# Patient Record
Sex: Female | Born: 1965 | Race: White | Hispanic: No | Smoking: Former smoker
Health system: Southern US, Community
[De-identification: ages and names within clinical notes are randomized; demographics above are authoritative.]

## PROBLEM LIST (undated history)

## (undated) DIAGNOSIS — R945 Abnormal results of liver function studies: Secondary | ICD-10-CM

## (undated) DIAGNOSIS — M797 Fibromyalgia: Secondary | ICD-10-CM

## (undated) DIAGNOSIS — G43909 Migraine, unspecified, not intractable, without status migrainosus: Secondary | ICD-10-CM

## (undated) DIAGNOSIS — F329 Major depressive disorder, single episode, unspecified: Secondary | ICD-10-CM

## (undated) DIAGNOSIS — E7800 Pure hypercholesterolemia, unspecified: Secondary | ICD-10-CM

## (undated) DIAGNOSIS — R42 Dizziness and giddiness: Secondary | ICD-10-CM

## (undated) DIAGNOSIS — R7989 Other specified abnormal findings of blood chemistry: Secondary | ICD-10-CM

## (undated) DIAGNOSIS — F419 Anxiety disorder, unspecified: Secondary | ICD-10-CM

## (undated) DIAGNOSIS — R7303 Prediabetes: Secondary | ICD-10-CM

## (undated) DIAGNOSIS — D649 Anemia, unspecified: Secondary | ICD-10-CM

## (undated) DIAGNOSIS — F32A Depression, unspecified: Secondary | ICD-10-CM

## (undated) DIAGNOSIS — M1990 Unspecified osteoarthritis, unspecified site: Secondary | ICD-10-CM

## (undated) DIAGNOSIS — J45909 Unspecified asthma, uncomplicated: Secondary | ICD-10-CM

## (undated) HISTORY — DX: Dizziness and giddiness: R42

## (undated) HISTORY — DX: Fibromyalgia: M79.7

## (undated) HISTORY — PX: BREAST SURGERY: SHX581

## (undated) HISTORY — PX: WISDOM TOOTH EXTRACTION: SHX21

## (undated) HISTORY — DX: Pure hypercholesterolemia, unspecified: E78.00

## (undated) HISTORY — DX: Abnormal results of liver function studies: R94.5

## (undated) HISTORY — DX: Migraine, unspecified, not intractable, without status migrainosus: G43.909

## (undated) HISTORY — DX: Other specified abnormal findings of blood chemistry: R79.89

## (undated) HISTORY — PX: CERVICAL CONE BIOPSY: SUR198

---

## 1986-10-01 HISTORY — PX: BREAST BIOPSY: SHX20

## 2006-04-29 ENCOUNTER — Ambulatory Visit: Payer: Self-pay | Admitting: Internal Medicine

## 2006-05-02 ENCOUNTER — Ambulatory Visit: Payer: Self-pay | Admitting: Internal Medicine

## 2006-07-22 ENCOUNTER — Ambulatory Visit (HOSPITAL_COMMUNITY): Admission: RE | Admit: 2006-07-22 | Discharge: 2006-07-22 | Payer: Self-pay | Admitting: Obstetrics & Gynecology

## 2006-09-06 ENCOUNTER — Emergency Department (HOSPITAL_COMMUNITY): Admission: EM | Admit: 2006-09-06 | Discharge: 2006-09-06 | Payer: Self-pay | Admitting: Emergency Medicine

## 2010-11-08 ENCOUNTER — Ambulatory Visit: Payer: Self-pay | Admitting: Unknown Physician Specialty

## 2011-11-13 ENCOUNTER — Ambulatory Visit: Payer: Self-pay | Admitting: Unknown Physician Specialty

## 2012-06-09 ENCOUNTER — Emergency Department: Payer: Self-pay | Admitting: Emergency Medicine

## 2012-06-09 LAB — BASIC METABOLIC PANEL
BUN: 15 mg/dL (ref 7–18)
Chloride: 109 mmol/L — ABNORMAL HIGH (ref 98–107)
Co2: 23 mmol/L (ref 21–32)
Creatinine: 0.88 mg/dL (ref 0.60–1.30)
Potassium: 3.6 mmol/L (ref 3.5–5.1)
Sodium: 139 mmol/L (ref 136–145)

## 2012-06-09 LAB — TROPONIN I: Troponin-I: <0.02 ng/mL

## 2012-06-09 LAB — CBC
HCT: 40.6 % (ref 35.0–47.0)
MCHC: 35.4 g/dL (ref 32.0–36.0)
RDW: 12.8 % (ref 11.5–14.5)

## 2012-11-26 ENCOUNTER — Ambulatory Visit: Payer: Self-pay | Admitting: Neurology

## 2013-03-02 ENCOUNTER — Encounter: Payer: Self-pay | Admitting: Nurse Practitioner

## 2013-03-02 ENCOUNTER — Ambulatory Visit (INDEPENDENT_AMBULATORY_CARE_PROVIDER_SITE_OTHER): Payer: 59 | Admitting: Nurse Practitioner

## 2013-03-02 VITALS — BP 124/70 | HR 101 | Ht 64.0 in | Wt 134.0 lb

## 2013-03-02 DIAGNOSIS — G43909 Migraine, unspecified, not intractable, without status migrainosus: Secondary | ICD-10-CM

## 2013-03-02 DIAGNOSIS — R42 Dizziness and giddiness: Secondary | ICD-10-CM

## 2013-03-02 NOTE — Patient Instructions (Addendum)
Discussed  migraine triggers, nothing new Increase fluids to 8 to 10 glasses day Will get EEG to rule out seizure activity Continue current meds for headache

## 2013-03-02 NOTE — Progress Notes (Signed)
HPI: Patient returns for followup after last visit with Dr. Terrace Arabia and 12/01/2012. She has a history of dizziness for about one year she also has a history of migraine headaches, hyperlipidemia fibromyalgia, diffuse back and upper neck pain. She was placed on nortriptyline for her headaches and is now having approximately 3 migraines per month. She uses Imitrex acutely. She says she typically misses a day of work every month. She has dizziness every day particularly with position changes, she said she was unable to drive here today due to her dizziness. She is with her daughter. MRI of the brain in the past with normal. Lack of sleep causes  migraine. She denies that any foods cause a problem. She gets no regular exercise  ROS:  - headache, joint pain, fatigue, dizziness, swelling in legs, spinning senation  Physical Exam General: well developed, well nourished, seated, in no evident distress Head: head normocephalic and atraumatic. Oropharynx benign, cerumen on the right.  Neck: supple with no carotid  bruits Cardiovascular: regular rate and rhythm, no murmurs  Neurologic Exam Mental Status: Awake and fully alert. Oriented to place and time. Follows all commands. Speech and language normal.   Cranial Nerves:  Pupils equal, briskly reactive to light. Extraocular movements full without nystagmus. Visual fields full to confrontation. Hearing intact and symmetric to finger snap. Facial sensation intact. Face, tongue, palate move normally and symmetrically. Neck flexion and extension normal.  Motor: Normal bulk and tone. Normal strength in all tested extremity muscles.No focal weakness Sensory.: intact to touch and pinprick and vibratory.  Coordination: Rapid alternating movements normal in all extremities. Finger-to-nose and heel-to-shin performed accurately bilaterally. Gait and Station: Arises from chair without difficulty. Stance is normal. Gait demonstrates normal stride length and balance . Able to  heel, toe and tandem walk without difficulty.  Reflexes: 2+ and symmetric. Toes downgoing.     ASSESSMENT: Migraine currently on nortriptyline 20 mg at night, and Imitrex acutely. Estimates she has 3 migraines per month. Dizziness that occurs every day, MRI of the brain and labs have been normal  No significant ortho stasis with B/P in lying, seated and standing position Does not drink enough water daily      PLAN: Discussed with Dr. Terrace Arabia Will get EEG to rule out seizure Keep well hydrated with 8-10 glasses of water daily Followup in 3 months Okay to stop magnesium and B  vitamins if not beneficial Additional 15 min spent face to face answering questions and discussing the next step for treatment  Nilda Riggs, GNP-BC APRN

## 2013-03-13 ENCOUNTER — Other Ambulatory Visit: Payer: 59

## 2013-06-08 ENCOUNTER — Ambulatory Visit (INDEPENDENT_AMBULATORY_CARE_PROVIDER_SITE_OTHER): Payer: 59 | Admitting: Nurse Practitioner

## 2013-06-08 ENCOUNTER — Encounter: Payer: Self-pay | Admitting: Nurse Practitioner

## 2013-06-08 VITALS — BP 111/69 | HR 78 | Ht 63.75 in | Wt 131.0 lb

## 2013-06-08 DIAGNOSIS — G43909 Migraine, unspecified, not intractable, without status migrainosus: Secondary | ICD-10-CM

## 2013-06-08 DIAGNOSIS — R42 Dizziness and giddiness: Secondary | ICD-10-CM

## 2013-06-08 NOTE — Patient Instructions (Addendum)
Continue nortriptyline 20 mg daily, may take 10 in the morning and 10 at night if necessary Try melatonin 3 mg when necessary for sleep Continue Imitrex acutely  followup in 6 months

## 2013-06-08 NOTE — Progress Notes (Signed)
[facility]  PATIENT: Robin Thomas DOB: 08-15-1966   REASON FOR VISIT: Followup for migraines and dizziness    HISTORY OF PRESENT ILLNESS: Robin Thomas, 47 year old white female returns for followup. She was last seen in our office 03/02/2013. She has a history of dizziness for about one year she also has a history of migraine headaches, hyperlipidemia fibromyalgia, diffuse back and upper neck pain. She was placed on nortriptyline for her headaches and is now having approximately 3 migraines per month. She uses Imitrex acutely. She says she typically misses a day of work every month. Her dizziness has improved but when it does occur usually late in the day. She was scheduled for EEG however she canceled the test  MRI of the brain in the past with normal. Lack of sleep causes migraine. She denies that any foods cause a problem. She gets no regular exercise. Her liver functions were found to be elevated recently. She just has  stopped smoking.     REVIEW OF SYSTEMS: Full 14 system review of systems performed and notable only for:  Constitutional: N/A  Cardiovascular: Swelling in the ankles Ear/Nose/Throat: N/A  Skin: N/A  Eyes: N/A  Respiratory: N/A  Gastroitestinal: N/A  Hematology/Lymphatic: N/A  Endocrine: Feeling hot, flushing  Musculoskeletal: Joint pain  Allergy/Immunology: N/A  Neurological: Headache, occasional dizziness  Psychiatric: Insomnia   ALLERGIES: No Known Allergies  HOME MEDICATIONS: Outpatient Prescriptions Prior to Visit  Medication Sig Dispense Refill  . Cholecalciferol (D3 ADULT PO) Take by mouth.      . fish oil-omega-3 fatty acids 1000 MG capsule Take 2 g by mouth daily.      . magnesium oxide (MAG-OX) 400 MG tablet Take 400 mg by mouth daily.      . nortriptyline (PAMELOR) 10 MG capsule Take 10 mg by mouth 2 (two) times daily.       . Riboflavin (B-2 PO) Take by mouth.      . SUMAtriptan (IMITREX) 100 MG tablet Take 100 mg by mouth  every 2 (two) hours as needed for migraine.        No facility-administered medications prior to visit.    PAST MEDICAL HISTORY: Past Medical History  Diagnosis Date  . High cholesterol   . Migraine   . Fibromyalgia   . Dizziness and giddiness     PAST SURGICAL HISTORY: Past Surgical History  Procedure Laterality Date  . Cervical cone biopsy      cervix  . Wisdom tooth extraction      FAMILY HISTORY: History reviewed. No pertinent family history.  SOCIAL HISTORY: History   Social History  . Marital Status: Legally Separated    Spouse Name: N/A    Number of Children: N/A  . 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   Social History Main Topics  . Smoking status: Former Smoker    Types: Cigarettes    Quit date: 03/31/2013  . Smokeless tobacco: Never Used  . Alcohol Use: Yes     Comment: 2 times on weekends  . Drug Use: No  . Sexual Activity: Not on file   Other Topics Concern  . Not on file   Social History Narrative   Patient lives at home with her son and works at [employer]. Patient drinks caffeine daily. Patient has a college degree.      PHYSICAL EXAM  Filed Vitals:   06/08/13 1543  BP: 111/69  Pulse: 78  Height: 5' 3.75" (1.619  m)  Weight: 131 lb (59.421 kg)   Body mass index is 22.67 kg/(m^2).  Generalized: Well developed, in no acute distress  Head: normocephalic and atraumatic,. Oropharynx benign  Neck: Supple, no carotid bruits  Cardiac: Regular rate rhythm, no murmur    Neurological examination   Mentation: Alert oriented to time, place, history taking. Follows all commands speech and language fluent  Cranial nerve II-XII: Pupils were equal round reactive to light extraocular movements were full, visual field were full on confrontational test. Facial sensation and strength were normal. hearing was intact to finger rubbing bilaterally. Uvula tongue midline. head turning and shoulder shrug and were  normal and symmetric.Tongue protrusion into cheek strength was normal. Motor: normal bulk and tone, full strength in the BUE, BLE, fine finger movements normal.  No focal weakness Sensory: normal and symmetric to light touch, pinprick, and  vibration  Coordination: finger-nose-finger, heel-to-shin bilaterally, no dysmetria Reflexes: Brachioradialis 2/2, biceps 2/2, triceps 2/2, patellar 2/2, Achilles 2/2, plantar responses were flexor bilaterally. Gait and Station: Rising up from seated position without assistance, normal stance, moderate stride, good arm swing, smooth turning, able to perform tiptoe, and heel walking without difficulty.   DIAGNOSTIC DATA (LABS, IMAGING, TESTING) - none for review   ASSESSMENT AND PLAN  47 y.o. year old female  has a past medical history of High cholesterol; Migraine; Fibromyalgia; and Dizziness and giddiness. here for followup. Dizziness is actually gotten better, she canceled her EEG. She also complains of insomnia Continue nortriptyline 20 mg daily, may take 10 in the morning and 10 at night if necessary Try melatonin 3 mg when necessary for sleep Continue Imitrex acutely  Followup in 6 months  Nilda Riggs, [facility], [facility], APRN  [facility] [address] [place], [place] [address] [phone]

## 2013-07-01 ENCOUNTER — Other Ambulatory Visit: Payer: Self-pay

## 2013-07-01 MED ORDER — NORTRIPTYLINE HCL 10 MG PO CAPS: 10.0000 mg | ORAL_CAPSULE | Freq: Two times a day (BID) | ORAL | Status: DC

## 2013-12-07 ENCOUNTER — Ambulatory Visit: Payer: 59 | Admitting: Nurse Practitioner

## 2013-12-15 ENCOUNTER — Ambulatory Visit (INDEPENDENT_AMBULATORY_CARE_PROVIDER_SITE_OTHER): Payer: 59 | Admitting: Nurse Practitioner

## 2013-12-15 ENCOUNTER — Encounter: Payer: Self-pay | Admitting: Nurse Practitioner

## 2013-12-15 ENCOUNTER — Encounter (INDEPENDENT_AMBULATORY_CARE_PROVIDER_SITE_OTHER): Payer: Self-pay

## 2013-12-15 VITALS — BP 109/72 | HR 111 | Ht 63.0 in | Wt 131.0 lb

## 2013-12-15 DIAGNOSIS — G43909 Migraine, unspecified, not intractable, without status migrainosus: Secondary | ICD-10-CM

## 2013-12-15 DIAGNOSIS — R42 Dizziness and giddiness: Secondary | ICD-10-CM

## 2013-12-15 MED ORDER — NORTRIPTYLINE HCL 10 MG PO CAPS: 10.0000 mg | ORAL_CAPSULE | Freq: Two times a day (BID) | ORAL | Status: DC

## 2013-12-15 NOTE — Progress Notes (Signed)
[facility]  PATIENT: Robin Thomas DOB: 11-29-1965   REASON FOR VISIT: Followup for migraines and history of dizziness    HISTORY OF PRESENT ILLNESS:Robin Thomas, 48 year old female returns for followup. She has a history of migraines with approximately one bad migraine per month and Imitrex works acutely. She continues to have dizziness every day.  MRI of the brain and labs have been normal in the past. She denies any significant orthostasis with blood pressure in lying seated and standing position. She had been asked to get an EEG in the past to rule out seizure events but she canceled that. Triggers  for migraine are lack of  sleep, she denies seasonal allergies or any foods that cause a problem. She does not exercise on a routine basis. She returns for reevaluation  HISTORY:of dizziness for about one year she also has a history of migraine headaches, hyperlipidemia fibromyalgia, diffuse back and upper neck pain. She was placed on nortriptyline for her headaches and is now having approximately 3 migraines per month. She uses Imitrex acutely. She says she typically misses a day of work every month. Her dizziness has improved but when it does occur usually late in the day. She was scheduled for EEG however she canceled the test MRI of the brain in the past with normal. Lack of sleep causes migraine. She denies that any foods cause a problem. She gets no regular exercise. Her liver functions were found to be elevated recently. She just has stopped smoking.    REVIEW OF SYSTEMS: Full 14 system review of systems performed and notable only for those listed, all others are neg:  Constitutional: N/A  Cardiovascular: N/A  Ear/Nose/Throat: N/A  Skin: N/A  Eyes: Light sensitivity  Respiratory: N/A  Gastroitestinal: N/A  Hematology/Lymphatic: N/A  Endocrine: N/A Musculoskeletal: Joint pain Allergy/Immunology: N/A  Neurological: Dizziness, headache Psychiatric:  N/A   ALLERGIES: No Known Allergies  HOME MEDICATIONS: Outpatient Prescriptions Prior to Visit  Medication Sig Dispense Refill  . Cholecalciferol (D3 ADULT PO) Take by mouth.      . fish oil-omega-3 fatty acids 1000 MG capsule Take 2 g by mouth daily.      . magnesium oxide (MAG-OX) 400 MG tablet Take 400 mg by mouth daily.      . nortriptyline (PAMELOR) 10 MG capsule Take 1 capsule (10 mg total) by mouth 2 (two) times daily.  180 capsule  1  . Riboflavin (B-2 PO) Take by mouth.      . SUMAtriptan (IMITREX) 100 MG tablet Take 100 mg by mouth every 2 (two) hours as needed for migraine.       Robin Thomas. CHANTIX CONTINUING MONTH PAK 1 MG tablet        No facility-administered medications prior to visit.    PAST MEDICAL HISTORY: Past Medical History  Diagnosis Date  . High cholesterol   . Migraine   . Fibromyalgia   . Dizziness and giddiness   . Elevated liver function tests     PAST SURGICAL HISTORY: Past Surgical History  Procedure Laterality Date  . Cervical cone biopsy      cervix  . Wisdom tooth extraction      FAMILY HISTORY: History reviewed. No pertinent family history.  SOCIAL HISTORY: History   Social History  . Marital Status: Legally Separated    Spouse Name: N/A    Number of Children: 3  . Years of Education: Assoc    Occupational History  . Not on file.   Social  History Main Topics  . Smoking status: Former Smoker    Types: Cigarettes    Quit date: 03/31/2013  . Smokeless tobacco: Never Used  . Alcohol Use: Yes     Comment: 2 times on weekends  . Drug Use: No  . Sexual Activity: Not on file   Other Topics Concern  . Not on file   Social History Narrative   Patient lives at home with her son and works at [employer]. Patient drinks caffeine daily. Patient has a college degree.    Patient has 3 children.    Patient has an Associates degree.            PHYSICAL EXAM  Filed Vitals:   12/15/13 1511 12/15/13 1512  BP: 112/67 109/72  Pulse: 94 111   Height: 5\' 3"  (1.6 m)   Weight: 131 lb (59.421 kg)    Body mass index is 23.21 kg/(m^2).  Generalized: Well developed, in no acute distress  Head: normocephalic and atraumatic,. Oropharynx benign  Neck: Supple, no carotid bruits  Cardiac: Regular rate rhythm, no murmur  Musculoskeletal: No deformity   Neurological examination   Mentation: Alert oriented to time, place, history taking. Follows all commands speech and language fluent  Cranial nerve II-XII: Pupils were equal round reactive to light extraocular movements were full, visual field were full on confrontational test. Facial sensation and strength were normal. hearing was intact to finger rubbing bilaterally. Uvula tongue midline. head turning and shoulder shrug were normal and symmetric.Tongue protrusion into cheek strength was normal. Motor: normal bulk and tone, full strength in the BUE, BLE, No focal weakness Sensory: normal and symmetric to light touch, pinprick, and  vibration  Coordination: finger-nose-finger, heel-to-shin bilaterally, no dysmetria Reflexes: Brachioradialis 2/2, biceps 2/2, triceps 2/2, patellar 2/2, Achilles 2/2, plantar responses were flexor bilaterally. Gait and Station: Rising up from seated position without assistance, normal stance,  moderate stride, good arm swing, smooth turning, able to perform tiptoe, and heel walking without difficulty. Tandem gait is steady  DIAGNOSTIC DATA (LABS, IMAGING, TESTING) -  ASSESSMENT AND PLAN  48 y.o. year old female  has a past medical history of High cholesterol; Migraine; Fibromyalgia; Dizziness and giddiness; and Elevated liver function tests. here to followup. She has one migraine per month relieved with Imitrex however she continues to have dizziness daily, MRI of the brain and labs  within normal limits in the past. She had been scheduled for an EEG but she canceled this.  EEG for continued complaints of dizziness Continue nortriptyline at the current dose  will refill mail-order Continue Imitrex acutely Followup in 6 months  Nilda Riggs, [facility], [facility], APRN  [facility] [address], [address] [place], [place] [address] [phone]

## 2013-12-15 NOTE — Patient Instructions (Signed)
EEG for continued complaints of dizziness Continue nortriptyline at the current dose will refill mail-order Continue Imitrex acutely Followup in 6 months

## 2014-04-19 ENCOUNTER — Other Ambulatory Visit: Payer: Self-pay | Admitting: Nurse Practitioner

## 2014-06-18 ENCOUNTER — Ambulatory Visit: Payer: 59 | Admitting: Nurse Practitioner

## 2014-07-09 ENCOUNTER — Other Ambulatory Visit: Payer: Self-pay | Admitting: Neurology

## 2014-11-03 ENCOUNTER — Other Ambulatory Visit: Payer: Self-pay | Admitting: Neurology

## 2015-02-15 ENCOUNTER — Ambulatory Visit: Payer: Self-pay | Admitting: Nurse Practitioner

## 2016-01-01 ENCOUNTER — Encounter: Payer: Self-pay | Admitting: Emergency Medicine

## 2016-01-01 ENCOUNTER — Emergency Department
Admission: EM | Admit: 2016-01-01 | Discharge: 2016-01-01 | Disposition: A | Discharge status: Home / Self Care | Payer: 59 | Attending: Student | Admitting: Student

## 2016-01-01 DIAGNOSIS — M65831 Other synovitis and tenosynovitis, right forearm: Secondary | ICD-10-CM

## 2016-01-01 DIAGNOSIS — Y9389 Activity, other specified: Secondary | ICD-10-CM | POA: Diagnosis not present

## 2016-01-01 DIAGNOSIS — S59911A Unspecified injury of right forearm, initial encounter: Secondary | ICD-10-CM | POA: Diagnosis present

## 2016-01-01 DIAGNOSIS — S50811A Abrasion of right forearm, initial encounter: Secondary | ICD-10-CM

## 2016-01-01 DIAGNOSIS — Y92410 Unspecified street and highway as the place of occurrence of the external cause: Secondary | ICD-10-CM | POA: Diagnosis not present

## 2016-01-01 DIAGNOSIS — S50812A Abrasion of left forearm, initial encounter: Secondary | ICD-10-CM | POA: Insufficient documentation

## 2016-01-01 DIAGNOSIS — M65841 Other synovitis and tenosynovitis, right hand: Secondary | ICD-10-CM | POA: Diagnosis not present

## 2016-01-01 DIAGNOSIS — L089 Local infection of the skin and subcutaneous tissue, unspecified: Secondary | ICD-10-CM

## 2016-01-01 DIAGNOSIS — Z87891 Personal history of nicotine dependence: Secondary | ICD-10-CM | POA: Diagnosis not present

## 2016-01-01 DIAGNOSIS — Y999 Unspecified external cause status: Secondary | ICD-10-CM | POA: Insufficient documentation

## 2016-01-01 MED ORDER — MELOXICAM 15 MG PO TABS
15.0000 mg | ORAL_TABLET | Freq: Every day | ORAL | Status: DC
Start: 2016-01-01 — End: 2016-08-21

## 2016-01-01 MED ORDER — CYCLOBENZAPRINE HCL 5 MG PO TABS: 5.0000 mg | ORAL_TABLET | Freq: Three times a day (TID) | ORAL | Status: DC | PRN
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
## 2016-01-01 NOTE — ED Notes (Signed)
Discussed discharge instructions, prescriptions, and follow-up care with patient. No questions or concerns at this time. Pt stable at discharge.  

## 2016-08-28 ENCOUNTER — Ambulatory Visit
Admission: RE | Admit: 2016-08-28 | Discharge: 2016-08-28 | Disposition: A | Discharge status: Home / Self Care | Payer: 59 | Source: Ambulatory Visit | Attending: Obstetrics & Gynecology | Admitting: Obstetrics & Gynecology

## 2016-08-28 ENCOUNTER — Other Ambulatory Visit: Payer: 59

## 2016-08-28 DIAGNOSIS — N950 Postmenopausal bleeding: Secondary | ICD-10-CM | POA: Diagnosis not present

## 2016-08-28 DIAGNOSIS — R7303 Prediabetes: Secondary | ICD-10-CM | POA: Diagnosis not present

## 2016-08-28 DIAGNOSIS — Z01818 Encounter for other preprocedural examination: Secondary | ICD-10-CM | POA: Insufficient documentation

## 2016-08-28 HISTORY — DX: Unspecified asthma, uncomplicated: J45.909

## 2016-08-28 HISTORY — DX: Major depressive disorder, single episode, unspecified: F32.9

## 2016-08-28 HISTORY — DX: Unspecified osteoarthritis, unspecified site: M19.90

## 2016-08-28 HISTORY — DX: Anemia, unspecified: D64.9

## 2016-08-28 HISTORY — DX: Anxiety disorder, unspecified: F41.9

## 2016-08-28 HISTORY — DX: Depression, unspecified: F32.A

## 2016-08-28 HISTORY — DX: Prediabetes: R73.03

## 2016-08-28 LAB — BASIC METABOLIC PANEL
Anion gap: 9 (ref 5–15)
BUN: 11 mg/dL (ref 6–20)
CO2: 27 mmol/L (ref 22–32)
CREATININE: 0.66 mg/dL (ref 0.44–1.00)
Calcium: 9.8 mg/dL (ref 8.9–10.3)
Chloride: 104 mmol/L (ref 101–111)
GFR calc Af Amer: >60 mL/min (ref >60)
GLUCOSE: 100 mg/dL — AB (ref 65–99)
Potassium: 3.8 mmol/L (ref 3.5–5.1)
SODIUM: 140 mmol/L (ref 135–145)

## 2016-08-28 LAB — CBC
HCT: 42.1 % (ref 35.0–47.0)
Hemoglobin: 14.5 g/dL (ref 12.0–16.0)
MCH: 31 pg (ref 26.0–34.0)
MCHC: 34.5 g/dL (ref 32.0–36.0)
MCV: 89.9 fL (ref 80.0–100.0)
PLATELETS: 205 10*3/uL (ref 150–440)
RBC: 4.69 MIL/uL (ref 3.80–5.20)
RDW: 12.6 % (ref 11.5–14.5)
WBC: 8 10*3/uL (ref 3.6–11.0)

## 2016-08-28 LAB — SURGICAL PCR SCREEN
MRSA, PCR: NEGATIVE
Staphylococcus aureus: NEGATIVE

## 2016-08-28 LAB — TYPE AND SCREEN
ABO/RH(D): A NEG
Antibody Screen: NEGATIVE

## 2016-08-28 NOTE — Patient Instructions (Signed)
  Your procedure is scheduled on: August 31, 2016 (Friday) Report to Same Day Surgery 2nd floor medical mall [facility](Medical Mall Entrance-take elevator on left to 2nd floor.  Check in with surgery information desk.) To find out your arrival time please call [phone] between 1PM - 3PM on August 30, 2016 (Thursday)  Remember: Instructions that are not followed completely may result in serious medical risk, up to and including death, or upon the discretion of your surgeon and anesthesiologist your surgery may need to be rescheduled.    _x___ 1. Do not eat food or drink liquids after midnight. No gum chewing or hard candies.     __x__ 2. No Alcohol for 24 hours before or after surgery.   __x__3. No Smoking for 24 prior to surgery.   ____  4. Bring all medications with you on the day of surgery if instructed.    __x__ 5. Notify your doctor if there is any change in your medical condition     (cold, fever, infections).     Do not wear jewelry, make-up, hairpins, clips or nail polish.  Do not wear lotions, powders, or perfumes. You may wear deodorant.  Do not shave 48 hours prior to surgery. Men may shave face and neck.  Do not bring valuables to the hospital.    [facility] any belongings or valuables.               Contacts, dentures or bridgework may not be worn into surgery.  Leave your suitcase in the car. After surgery it may be brought to your room.  For patients admitted to the hospital, discharge time is determined by your treatment team.   Patients discharged the day of surgery will not be allowed to drive home.  You will need someone to drive you home and stay with you the night of your procedure.    Please read over the following fact sheets that you were given:   [facility] Preparing [facility] and or MRSA Information   ___ Take these medicines the morning of surgery with A SIP OF WATER:    1.   2.  3.  4.  5.  6.  ____Fleets enema or  Magnesium Citrate as directed.   ___ Use CHG Soap or sage wipes as directed on instruction sheet   ____ Use inhalers on the day of surgery and bring to hospital day of surgery  ____ Stop metformin 2 days prior to surgery    ____ Take 1/2 of usual insulin dose the night before surgery and none on the morning of           surgery.   _x___ Stop Aspirin, Coumadin, Pllavix ,Eliquis, Effient, or Pradaxa  x__ Stop Anti-inflammatories such as Advil, Aleve, Ibuprofen, Motrin, Naproxen,          Naprosyn, Goodies powders or aspirin products. Ok to take Tylenol.   _x___ Stop supplements until after surgery.   Stop Biotin, Omega-3, and Melatonin now)  ____ Bring C-Pap to the hospital.

## 2016-08-29 ENCOUNTER — Other Ambulatory Visit: Payer: 59

## 2016-08-31 ENCOUNTER — Ambulatory Visit
Admission: RE | Admit: 2016-08-31 | Discharge: 2016-08-31 | Disposition: A | Discharge status: Home / Self Care | Payer: 59 | Source: Ambulatory Visit | Attending: Obstetrics & Gynecology | Admitting: Obstetrics & Gynecology

## 2016-08-31 ENCOUNTER — Ambulatory Visit: Payer: 59 | Admitting: Certified Registered Nurse Anesthetist

## 2016-08-31 ENCOUNTER — Encounter
Admission: RE | Disposition: A | Discharge status: Home / Self Care | Payer: Self-pay | Source: Ambulatory Visit | Attending: Obstetrics & Gynecology

## 2016-08-31 ENCOUNTER — Encounter: Payer: Self-pay | Admitting: *Deleted

## 2016-08-31 DIAGNOSIS — F329 Major depressive disorder, single episode, unspecified: Secondary | ICD-10-CM | POA: Insufficient documentation

## 2016-08-31 DIAGNOSIS — R7303 Prediabetes: Secondary | ICD-10-CM | POA: Insufficient documentation

## 2016-08-31 DIAGNOSIS — F419 Anxiety disorder, unspecified: Secondary | ICD-10-CM | POA: Diagnosis not present

## 2016-08-31 DIAGNOSIS — M797 Fibromyalgia: Secondary | ICD-10-CM | POA: Diagnosis not present

## 2016-08-31 DIAGNOSIS — E7800 Pure hypercholesterolemia, unspecified: Secondary | ICD-10-CM | POA: Insufficient documentation

## 2016-08-31 DIAGNOSIS — Z87891 Personal history of nicotine dependence: Secondary | ICD-10-CM | POA: Diagnosis not present

## 2016-08-31 DIAGNOSIS — N950 Postmenopausal bleeding: Secondary | ICD-10-CM | POA: Diagnosis present

## 2016-08-31 DIAGNOSIS — J45909 Unspecified asthma, uncomplicated: Secondary | ICD-10-CM | POA: Diagnosis not present

## 2016-08-31 HISTORY — PX: HYSTEROSCOPY W/D&C: SHX1775

## 2016-08-31 HISTORY — PX: LAPAROSCOPY: SHX197

## 2016-08-31 LAB — POCT PREGNANCY, URINE: Preg Test, Ur: NEGATIVE

## 2016-08-31 LAB — ABO/RH: ABO/RH(D): A NEG

## 2016-08-31 LAB — GLUCOSE, CAPILLARY: Glucose-Capillary: 104 mg/dL — ABNORMAL HIGH (ref 65–99)

## 2016-08-31 SURGERY — DILATATION AND CURETTAGE /HYSTEROSCOPY
Anesthesia: General | Wound class: Clean Contaminated

## 2016-08-31 MED ORDER — FENTANYL CITRATE (PF) 100 MCG/2ML IJ SOLN
INTRAMUSCULAR | Status: DC | PRN
  Administered 2016-08-31 (×2): 50 ug via INTRAVENOUS

## 2016-08-31 MED ORDER — SUCCINYLCHOLINE CHLORIDE 20 MG/ML IJ SOLN
INTRAMUSCULAR | Status: DC | PRN
  Administered 2016-08-31: 100 mg via INTRAVENOUS

## 2016-08-31 MED ORDER — EPHEDRINE SULFATE 50 MG/ML IJ SOLN
INTRAMUSCULAR | Status: DC | PRN
  Administered 2016-08-31 (×3): 10 mg via INTRAVENOUS

## 2016-08-31 MED ORDER — MIDAZOLAM HCL 2 MG/2ML IJ SOLN
INTRAMUSCULAR | Status: DC | PRN
  Administered 2016-08-31: 2 mg via INTRAVENOUS

## 2016-08-31 MED ORDER — FAMOTIDINE 20 MG PO TABS
ORAL_TABLET | ORAL | Status: AC
  Filled 2016-08-31: qty 1

## 2016-08-31 MED ORDER — FAMOTIDINE 20 MG PO TABS
20.0000 mg | ORAL_TABLET | Freq: Once | ORAL | Status: AC
  Administered 2016-08-31: 20 mg via ORAL

## 2016-08-31 MED ORDER — OXYCODONE-ACETAMINOPHEN 5-325 MG PO TABS: 1.0000 | ORAL_TABLET | ORAL | 0 refills | Status: AC | PRN

## 2016-08-31 MED ORDER — LIDOCAINE HCL (CARDIAC) 20 MG/ML IV SOLN
INTRAVENOUS | Status: DC | PRN
  Administered 2016-08-31: 30 mg via INTRAVENOUS

## 2016-08-31 MED ORDER — DEXAMETHASONE SODIUM PHOSPHATE 10 MG/ML IJ SOLN
INTRAMUSCULAR | Status: DC | PRN
  Administered 2016-08-31: 4 mg via INTRAVENOUS

## 2016-08-31 MED ORDER — OXYCODONE-ACETAMINOPHEN 5-325 MG PO TABS
1.0000 | ORAL_TABLET | ORAL | Status: DC | PRN
Start: 2016-08-31 — End: 2016-08-31
  Administered 2016-08-31: 1 via ORAL

## 2016-08-31 MED ORDER — ONDANSETRON HCL 4 MG/2ML IJ SOLN
INTRAMUSCULAR | Status: DC | PRN
  Administered 2016-08-31: 4 mg via INTRAVENOUS

## 2016-08-31 MED ORDER — ONDANSETRON HCL 4 MG/2ML IJ SOLN
4.0000 mg | Freq: Once | INTRAMUSCULAR | Status: AC | PRN
  Administered 2016-08-31: 4 mg via INTRAVENOUS

## 2016-08-31 MED ORDER — LIDOCAINE HCL (PF) 1 % IJ SOLN
INTRAMUSCULAR | Status: AC
  Filled 2016-08-31: qty 30

## 2016-08-31 MED ORDER — PROPOFOL 10 MG/ML IV BOLUS
INTRAVENOUS | Status: DC | PRN
  Administered 2016-08-31: 50 mg via INTRAVENOUS
  Administered 2016-08-31: 150 mg via INTRAVENOUS

## 2016-08-31 MED ORDER — FENTANYL CITRATE (PF) 100 MCG/2ML IJ SOLN: 25.0000 ug | INTRAMUSCULAR | Status: DC | PRN

## 2016-08-31 MED ORDER — OXYCODONE-ACETAMINOPHEN 5-325 MG PO TABS
ORAL_TABLET | ORAL | Status: AC
  Filled 2016-08-31: qty 1

## 2016-08-31 MED ORDER — LIDOCAINE HCL 1 % IJ SOLN
INTRAMUSCULAR | Status: DC | PRN
  Administered 2016-08-31: 20 mL

## 2016-08-31 MED ORDER — IBUPROFEN 600 MG PO TABS: 600.0000 mg | ORAL_TABLET | Freq: Four times a day (QID) | ORAL | 0 refills | Status: DC | PRN

## 2016-08-31 MED ORDER — KETOROLAC TROMETHAMINE 30 MG/ML IJ SOLN
INTRAMUSCULAR | Status: DC | PRN
  Administered 2016-08-31: 30 mg via INTRAVENOUS

## 2016-08-31 MED ORDER — LACTATED RINGERS IV SOLN
INTRAVENOUS | Status: DC | PRN
  Administered 2016-08-31: 12:00:00 via INTRAVENOUS

## 2016-08-31 MED ORDER — SODIUM CHLORIDE 0.9 % IV SOLN
INTRAVENOUS | Status: DC
  Administered 2016-08-31: 11:00:00 via INTRAVENOUS

## 2016-08-31 MED ORDER — ACETAMINOPHEN 10 MG/ML IV SOLN
INTRAVENOUS | Status: DC | PRN
  Administered 2016-08-31: 1000 mg via INTRAVENOUS

## 2016-08-31 MED ORDER — ONDANSETRON HCL 4 MG/2ML IJ SOLN
INTRAMUSCULAR | Status: AC
  Filled 2016-08-31: qty 2

## 2016-08-31 SURGICAL SUPPLY — 22 items
BAG INFUSER PRESSURE 100CC (MISCELLANEOUS) ×4 IMPLANT
ELECT REM PT RETURN 9FT ADLT (ELECTROSURGICAL) ×4
ELECTRODE REM PT RTRN 9FT ADLT (ELECTROSURGICAL) ×2 IMPLANT
GLOVE PI ORTHOPRO 6.5 (GLOVE) ×2
GLOVE PI ORTHOPRO STRL 6.5 (GLOVE) ×2 IMPLANT
GLOVE SURG SYN 6.5 ES PF (GLOVE) ×4 IMPLANT
GOWN STRL REUS W/ TWL LRG LVL3 (GOWN DISPOSABLE) ×4 IMPLANT
GOWN STRL REUS W/TWL LRG LVL3 (GOWN DISPOSABLE) ×4
IRRIGATION STRYKERFLOW (MISCELLANEOUS) ×2 IMPLANT
IRRIGATOR STRYKERFLOW (MISCELLANEOUS) ×4
IV LACTATED RINGERS 1000ML (IV SOLUTION) ×4 IMPLANT
KIT RM TURNOVER CYSTO AR (KITS) ×4 IMPLANT
NEEDLE SPNL 22GX3.5 QUINCKE BK (NEEDLE) ×4 IMPLANT
PACK DNC HYST (MISCELLANEOUS) ×4 IMPLANT
PAD OB MATERNITY 4.3X12.25 (PERSONAL CARE ITEMS) ×4 IMPLANT
PAD PREP 24X41 OB/GYN DISP (PERSONAL CARE ITEMS) ×4 IMPLANT
SLEEVE ENDOPATH XCEL 5M (ENDOMECHANICALS) ×4 IMPLANT
SYRINGE 10CC LL (SYRINGE) ×4 IMPLANT
TROCAR XCEL NON-BLD 5MMX100MML (ENDOMECHANICALS) ×4 IMPLANT
TUBING CONNECTING 10 (TUBING) ×3 IMPLANT
TUBING CONNECTING 10' (TUBING) ×1
TUBING INSUFFLATOR HEATED (MISCELLANEOUS) ×4 IMPLANT

## 2016-08-31 NOTE — Discharge Instructions (Signed)

## 2016-08-31 NOTE — Anesthesia Postprocedure Evaluation (Signed)
Anesthesia Post Note  Patient: Robin Thomas  Procedure(s) Performed: Procedure(s) (LRB): DILATATION AND CURETTAGE /HYSTEROSCOPY (N/A) LAPAROSCOPY DIAGNOSTIC  Patient location during evaluation: PACU Anesthesia Type: General Level of consciousness: awake and alert Pain management: pain level controlled Vital Signs Assessment: post-procedure vital signs reviewed and stable Respiratory status: spontaneous breathing and respiratory function stable Cardiovascular status: stable Anesthetic complications: no    Last Vitals:  Vitals:   08/31/16 1026 08/31/16 1352  BP: (!) 141/75 123/67  Pulse: 74 (!) 110  Resp: 16 15  Temp: 36.9 C 36.1 C    Last Pain:  Vitals:   08/31/16 1026  TempSrc: Oral                 KEPHART,WILLIAM K

## 2016-08-31 NOTE — H&P (Signed)
Consult History and Physical   SERVICE: Gynecology   Patient Name: Robin Thomas Patient MRN:   [id number]  CC: post menopausal bleeding  HPI: Robin Thomas is a 50 y.o. who presented as outpatient for postmenopausal bleeding and was found to have a thickened endometrium.  EMB was attempted x2 with cytotec and was not able to penetrate the stenotic os.   Review of Systems: positives in bold GEN:   fevers, chills, weight changes, appetite changes, fatigue, night sweats HEENT:  HA, vision changes, hearing loss, congestion, rhinorrhea, sinus pressure, dysphagia CV:   CP, palpitations PULM:  SOB, cough GI:  abd pain, N/V/D/C GU:  dysuria, urgency, frequency MSK:  arthralgias, myalgias, back pain, swelling SKIN:  rashes, color changes, pallor NEURO:  numbness, weakness, tingling, seizures, dizziness, tremors PSYCH:  depression, anxiety, behavioral problems, confusion  HEME/LYMPH:  easy bruising or bleeding ENDO:  heat/cold intolerance  Past Obstetrical History: OB History    No data available      Past Gynecologic History: No LMP recorded. Patient is postmenopausal.   Past Medical History: Past Medical History:  Diagnosis Date  . Anemia   . Anxiety   . Arthritis   . Asthma    childhood asthma  . Depression   . Dizziness and giddiness   . Elevated liver function tests   . Fibromyalgia   . High cholesterol   . Migraine   . Pre-diabetes     Past Surgical History:   Past Surgical History:  Procedure Laterality Date  . BREAST SURGERY Left    Breast Biopsy   . CERVICAL CONE BIOPSY     cervix  . WISDOM TOOTH EXTRACTION      Family History:  family history includes Diabetes in her father; Stroke in her father.  Social History:  Social History   Social History  . Marital status: Divorced    Spouse name: N/A  . Number of children: 3  . Years of education: Assoc    Occupational History  . Not on file.   Social History Main Topics  . Smoking status: Former  Smoker    Types: Cigarettes    Quit date: 03/31/2013  . Smokeless tobacco: Never Used  . Alcohol use Yes     Comment: 2 times on weekends  . Drug use: No  . Sexual activity: Not on file   Other Topics Concern  . Not on file   Social History Narrative   Patient lives at home with her son and works at [employer]. Patient drinks caffeine daily. Patient has a college degree.    Patient has 3 children.    Patient has an Associates degree.           Home Medications:  Medications reconciled in EPIC  No current facility-administered medications on file prior to encounter.    Current Outpatient Prescriptions on File Prior to Encounter  Medication Sig Dispense Refill  . Cholecalciferol (VITAMIN D) 2000 UNITS CAPS Take 2,000 Units by mouth every evening.     . fish oil-omega-3 fatty acids 1000 MG capsule Take 1 g by mouth every evening.     . nortriptyline (PAMELOR) 10 MG capsule Take 1 capsule by mouth two times daily (Patient taking differently: TAKE 1 CAPSULE (10 MG) BY MOUTH AT BEDTIME.) 60 capsule 0  . SUMAtriptan (IMITREX) 100 MG tablet Take 100 mg by mouth every 2 (two) hours as needed for migraine.     . cyclobenzaprine (FLEXERIL) 5 MG tablet Take 1 tablet (  5 mg total) by mouth every 8 (eight) hours as needed for muscle spasms. (Patient not taking: Reported on 08/21/2016) 30 tablet 1    Allergies:  No Known Allergies  Physical Exam:  Temp:  [98.4 F (36.9 C)] 98.4 F (36.9 C) (12/01 1026) Pulse Rate:  [74] 74 (12/01 1026) Resp:  [16] 16 (12/01 1026) BP: (141)/(75) 141/75 (12/01 1026) SpO2:  [100 %] 100 % (12/01 1026) Weight:  [59.9 kg (132 lb)] 59.9 kg (132 lb) (12/01 1026)   General Appearance:  Well developed, well nourished, no acute distress, alert and oriented, cooperative and appears stated age HEENT:  Normocephalic atraumatic, extraocular movements intact, moist mucous membranes, neck supple with midline trachea and thyroid without masses Cardiovascular:  Normal  S1/S2, regular rate and rhythm, no murmurs, 2+ distal pulses Pulmonary:  clear to auscultation, no wheezes, rales or rhonchi, symmetric air entry, good air exchange Abdomen:  Bowel sounds present, soft, nontender, nondistended, no abnormal masses or organomegaly, no epigastric pain Back: inspection of back is normal Extremities:  extremities normal, no tenderness, atraumatic, no cyanosis or edema Skin:  normal coloration and turgor, no rashes, no suspicious skin lesions noted  Neurologic:  Cranial nerves 2-12 grossly intact, grossly equal strength and muscle tone, normal speech, no focal findings or movement disorder noted. Psychiatric:  Normal mood and affect, appropriate, no AH/VH Pelvic:  NEFG, no vulvar masses or lesions, normal vaginal mucosa, no vaginal bleeding or discharge, cervix without lesions or erythema, normal smooth uterus, no adnexal masses appreciated, no palpable nodularity on rectovaginal exam, no pelvic organ prolapse  STENOTIC OS  Labs/Studies:   CBC and Coags:  Lab Results  Component Value Date   WBC 8.0 08/28/2016   HGB 14.5 08/28/2016   HCT 42.1 08/28/2016   MCV 89.9 08/28/2016   PLT 205 08/28/2016   CMP:  Lab Results  Component Value Date   NA 140 08/28/2016   K 3.8 08/28/2016   CL 104 08/28/2016   CO2 27 08/28/2016   BUN 11 08/28/2016   CREATININE 0.66 08/28/2016   CREATININE 0.88 06/09/2012     Assessment / Plan:   Robin Thomas is a 50 y.o. with PMB for scheduled Dilation and Curettage and Hysteroscopy.  1. Patient has agreed to the procedure after reviewing the risks and benefits.  Consents signed.    ----- Ranae Plumberhelsea Lanier Millon, MD Attending Obstetrician and Gynecologist [facility], Department of OB/GYN [facility]

## 2016-08-31 NOTE — Op Note (Signed)
Operative Report Hysteroscopy, Dilation and Curettage 08/31/2016  Patient:  Robin Thomas  50 y.o. female Preoperative diagnosis:  PMB Postoperative diagnosis:  PMB, perforated uterus  PROCEDURE:  Procedure(s): DILATATION AND CURETTAGE /HYSTEROSCOPY (N/A) LAPAROSCOPY DIAGNOSTIC Surgeon:  Surgeon(s) and Role:    * Keshon Markovitz C Kendre Jacinto, MD - Primary Anesthesia:  LMA converted to GET I/O: Total I/O In: 1400 [I.V.:1400] Out: 210 [Urine:200; Blood:10] Specimens:  none Complications: None Apparent Disposition:  VS stable to PACU  Findings: Uterus, mobile, normal size, sounding to 7cm; normal cervix, vagina, perineum.  Intraabdominal findings: posterior perforation, hemostatic.  Long bilateral ovaries from IP to cornua.  Small subserosal fibroid on left, inferior to cornua.  Indication for procedure/Consents: 50 y.o. P3003  here for scheduled surgery for the aforementioned diagnoses.  SHe presented with postmenopausal bleeding and had an endometrial stripe of 5+mm, and is s/p 2 attempts at an office EMB that were terminated due to severe pain and stenotic os.   Risks of surgery were discussed with the patient including but not limited to: bleeding which may require transfusion; infection which may require antibiotics; injury to uterus or surrounding organs; intrauterine scarring which may impair future fertility; need for additional procedures including laparotomy or laparoscopy; and other postoperative/anesthesia complications. Written informed consent was obtained.    Procedure Details:   The patient was then taken to the operating room where anesthesia was administered and was found to be adequate.  After a formal and adequate timeout was performed, she was placed in the dorsal lithotomy position and examined with the above findings. She was then prepped and draped in the sterile manner.  A speculum was then placed in the patient's vagina and a single tooth tenaculum was applied to the anterior lip  of the cervix.  A paracervical block was placed.  A Lacrimal dilator was used to attempt to gain entry to the cervix. This was achieved and then the sound was used. The uterus was sounded to 7cm, with firm resistance at the fundus. Her cervix was serially dilated to accommodate the hysteroscope, which then demonstrated fat, consistent with a perforation. The camera removed.  Fluid loss was 100cc.  The tenaculum was removed from the anterior lip of the cervix and the vaginal speculum was removed after noting good hemostasis.  A sponge stick was placed in the vagina.  The decision was made then to proceed with a diagnostic laparoscopy.  The abdominal drapes were removed and the abdomen was prepped with chlorahexadine. The LMA was converted to GET.  Another surgical time-out was performed. An 11-blade was used for incision of the skin at the umbilicus, and a 5mm visiport was used to enter the abdominal cavity.  The opening pressure was 6mmHg and insufflation of CO2 until 15mmHg.  The fluid from the procedure was seen and obscuring the view of the uterus, so another 5mm trochar was inserted in the LLQ under visualization.  The fluid was evacuated via suction and the tissues were inspected.  The posterior perforation site was identified, and found to be hemostatic.  The surrounding tissues were evaluated and although the ovaries were unique in appearance, they were equal bilaterally, and did not seem pathogenic.  The abdomen was desufflated and instruments were removed.   The patient tolerated the procedure well and was taken to the recovery area awake, extubated and in stable condition.  The patient will be discharged to home as per PACU criteria.  Routine postoperative instructions given. She will follow up in the clinic  in two to four weeks for postoperative evaluation.  Ranae Plumberhelsea Suhayb Anzalone, MD [name]

## 2016-08-31 NOTE — Anesthesia Preprocedure Evaluation (Signed)
Anesthesia Evaluation  Patient identified by MRN, date of birth, ID band Patient awake    Reviewed: Allergy & Precautions, NPO status , Patient's Chart, lab work & pertinent test results  History of Anesthesia Complications Negative for: history of anesthetic complications  Airway Mallampati: II       Dental   Pulmonary asthma , former smoker,           Cardiovascular negative cardio ROS       Neuro/Psych Anxiety Depression    GI/Hepatic negative GI ROS, Neg liver ROS,   Endo/Other  negative endocrine ROS  Renal/GU negative Renal ROS     Musculoskeletal   Abdominal   Peds  Hematology  (+) anemia ,   Anesthesia Other Findings   Reproductive/Obstetrics                             Anesthesia Physical Anesthesia Plan  ASA: II  Anesthesia Plan: General   Post-op Pain Management:    Induction: Intravenous  Airway Management Planned: LMA  Additional Equipment:   Intra-op Plan:   Post-operative Plan:   Informed Consent: I have reviewed the patients History and Physical, chart, labs and discussed the procedure including the risks, benefits and alternatives for the proposed anesthesia with the patient or authorized representative who has indicated his/her understanding and acceptance.     Plan Discussed with:   Anesthesia Plan Comments:         Anesthesia Quick Evaluation

## 2016-08-31 NOTE — Anesthesia Procedure Notes (Addendum)
Procedure Name: LMA Insertion Date/Time: 08/31/2016 12:34 PM Performed by: Marlana SalvageJESSUP, Linday Rhodes Pre-anesthesia Checklist: Patient identified, Emergency Drugs available, Suction available and Patient being monitored Patient Re-evaluated:Patient Re-evaluated prior to inductionOxygen Delivery Method: Circle system utilized Preoxygenation: Pre-oxygenation with 100% oxygen Intubation Type: IV induction Ventilation: Mask ventilation without difficulty LMA: LMA inserted LMA Size: 4.0 Number of attempts: 1 Placement Confirmation: positive ETCO2 and breath sounds checked- equal and bilateral Dental Injury: Teeth and Oropharynx as per pre-operative assessment

## 2016-08-31 NOTE — Transfer of Care (Signed)
Immediate Anesthesia Transfer of Care Note  Patient: Robin Thomas  Procedure(s) Performed: Procedure(s): DILATATION AND CURETTAGE /HYSTEROSCOPY (N/A) LAPAROSCOPY DIAGNOSTIC  Patient Location: PACU  Anesthesia Type:General  Level of Consciousness: awake, alert  and oriented  Airway & Oxygen Therapy: Patient Spontanous Breathing and Patient connected to face mask oxygen  Post-op Assessment: Report given to RN and Post -op Vital signs reviewed and stable  Post vital signs: Reviewed and stable  Last Vitals:  Vitals:   08/31/16 1026 08/31/16 1352  BP: (!) 141/75 123/67  Pulse: 74 (!) 110  Resp: 16 15  Temp: 36.9 C 36.1 C    Last Pain:  Vitals:   08/31/16 1026  TempSrc: Oral         Complications: No apparent anesthesia complications

## 2016-09-03 ENCOUNTER — Encounter: Payer: Self-pay | Admitting: Obstetrics & Gynecology

## 2017-04-15 ENCOUNTER — Other Ambulatory Visit: Payer: Self-pay | Admitting: Obstetrics & Gynecology

## 2017-04-15 DIAGNOSIS — N643 Galactorrhea not associated with childbirth: Secondary | ICD-10-CM

## 2017-04-22 ENCOUNTER — Ambulatory Visit
Admission: RE | Admit: 2017-04-22 | Discharge: 2017-04-22 | Disposition: A | Discharge status: Home / Self Care | Payer: Self-pay | Source: Ambulatory Visit | Attending: *Deleted | Admitting: *Deleted

## 2017-04-22 ENCOUNTER — Other Ambulatory Visit: Payer: Self-pay | Admitting: *Deleted

## 2017-04-22 ENCOUNTER — Ambulatory Visit
Admission: RE | Admit: 2017-04-22 | Discharge: 2017-04-22 | Disposition: A | Discharge status: Home / Self Care | Payer: 59 | Source: Ambulatory Visit | Attending: Obstetrics & Gynecology | Admitting: Obstetrics & Gynecology

## 2017-04-22 DIAGNOSIS — O926 Galactorrhea: Secondary | ICD-10-CM | POA: Insufficient documentation

## 2017-04-22 DIAGNOSIS — Z9289 Personal history of other medical treatment: Secondary | ICD-10-CM

## 2017-04-22 DIAGNOSIS — N643 Galactorrhea not associated with childbirth: Secondary | ICD-10-CM

## 2017-04-23 ENCOUNTER — Other Ambulatory Visit: Payer: Self-pay | Admitting: Neurology

## 2017-04-23 DIAGNOSIS — R200 Anesthesia of skin: Secondary | ICD-10-CM

## 2017-04-23 DIAGNOSIS — R202 Paresthesia of skin: Principal | ICD-10-CM

## 2017-11-28 ENCOUNTER — Ambulatory Visit: Payer: 59 | Admitting: Psychology

## 2017-11-28 DIAGNOSIS — F4310 Post-traumatic stress disorder, unspecified: Secondary | ICD-10-CM | POA: Diagnosis not present

## 2017-12-05 ENCOUNTER — Ambulatory Visit: Payer: 59 | Admitting: Psychology

## 2017-12-05 DIAGNOSIS — F4310 Post-traumatic stress disorder, unspecified: Secondary | ICD-10-CM | POA: Diagnosis not present

## 2017-12-12 ENCOUNTER — Ambulatory Visit (INDEPENDENT_AMBULATORY_CARE_PROVIDER_SITE_OTHER): Payer: 59 | Admitting: Psychology

## 2017-12-12 DIAGNOSIS — F4310 Post-traumatic stress disorder, unspecified: Secondary | ICD-10-CM | POA: Diagnosis not present

## 2017-12-18 ENCOUNTER — Ambulatory Visit: Payer: 59 | Admitting: Psychology

## 2017-12-19 ENCOUNTER — Ambulatory Visit: Payer: 59 | Admitting: Psychology

## 2017-12-26 ENCOUNTER — Ambulatory Visit (INDEPENDENT_AMBULATORY_CARE_PROVIDER_SITE_OTHER): Payer: 59 | Admitting: Psychology

## 2017-12-26 DIAGNOSIS — F4310 Post-traumatic stress disorder, unspecified: Secondary | ICD-10-CM

## 2017-12-31 ENCOUNTER — Ambulatory Visit (INDEPENDENT_AMBULATORY_CARE_PROVIDER_SITE_OTHER): Payer: 59 | Admitting: Psychology

## 2017-12-31 DIAGNOSIS — F4310 Post-traumatic stress disorder, unspecified: Secondary | ICD-10-CM | POA: Diagnosis not present

## 2018-01-02 ENCOUNTER — Ambulatory Visit: Payer: 59 | Admitting: Psychology

## 2018-01-09 ENCOUNTER — Ambulatory Visit: Payer: 59 | Admitting: Psychology

## 2018-01-09 DIAGNOSIS — F4310 Post-traumatic stress disorder, unspecified: Secondary | ICD-10-CM

## 2018-01-14 ENCOUNTER — Ambulatory Visit (INDEPENDENT_AMBULATORY_CARE_PROVIDER_SITE_OTHER): Payer: 59 | Admitting: Psychology

## 2018-01-14 DIAGNOSIS — F4310 Post-traumatic stress disorder, unspecified: Secondary | ICD-10-CM | POA: Diagnosis not present

## 2018-01-16 ENCOUNTER — Ambulatory Visit: Payer: 59 | Admitting: Psychology

## 2018-01-23 ENCOUNTER — Ambulatory Visit: Payer: 59 | Admitting: Psychology

## 2018-01-23 DIAGNOSIS — F4310 Post-traumatic stress disorder, unspecified: Secondary | ICD-10-CM | POA: Diagnosis not present

## 2018-01-30 ENCOUNTER — Ambulatory Visit (INDEPENDENT_AMBULATORY_CARE_PROVIDER_SITE_OTHER): Payer: 59 | Admitting: Psychology

## 2018-01-30 DIAGNOSIS — F4310 Post-traumatic stress disorder, unspecified: Secondary | ICD-10-CM | POA: Diagnosis not present

## 2018-02-06 ENCOUNTER — Ambulatory Visit: Payer: 59 | Admitting: Psychology

## 2018-02-13 ENCOUNTER — Ambulatory Visit (INDEPENDENT_AMBULATORY_CARE_PROVIDER_SITE_OTHER): Payer: 59 | Admitting: Psychology

## 2018-02-13 DIAGNOSIS — F4310 Post-traumatic stress disorder, unspecified: Secondary | ICD-10-CM

## 2018-02-20 ENCOUNTER — Ambulatory Visit (INDEPENDENT_AMBULATORY_CARE_PROVIDER_SITE_OTHER): Payer: 59 | Admitting: Psychology

## 2018-02-20 DIAGNOSIS — F4310 Post-traumatic stress disorder, unspecified: Secondary | ICD-10-CM | POA: Diagnosis not present

## 2018-02-27 ENCOUNTER — Ambulatory Visit: Payer: 59 | Admitting: Psychology

## 2018-02-27 ENCOUNTER — Ambulatory Visit (INDEPENDENT_AMBULATORY_CARE_PROVIDER_SITE_OTHER): Payer: 59 | Admitting: Psychology

## 2018-02-27 DIAGNOSIS — F4310 Post-traumatic stress disorder, unspecified: Secondary | ICD-10-CM

## 2018-03-06 ENCOUNTER — Ambulatory Visit: Payer: 59 | Admitting: Psychology

## 2018-03-13 ENCOUNTER — Ambulatory Visit: Payer: 59 | Admitting: Psychology

## 2018-03-20 ENCOUNTER — Ambulatory Visit (INDEPENDENT_AMBULATORY_CARE_PROVIDER_SITE_OTHER): Payer: 59 | Admitting: Psychology

## 2018-03-20 DIAGNOSIS — F4310 Post-traumatic stress disorder, unspecified: Secondary | ICD-10-CM

## 2018-03-27 ENCOUNTER — Ambulatory Visit: Payer: 59 | Admitting: Psychology

## 2018-04-02 ENCOUNTER — Ambulatory Visit: Payer: 59 | Admitting: Psychology

## 2018-04-02 DIAGNOSIS — F410 Panic disorder [episodic paroxysmal anxiety] without agoraphobia: Secondary | ICD-10-CM | POA: Diagnosis not present

## 2018-04-10 ENCOUNTER — Ambulatory Visit: Payer: 59 | Admitting: Psychology

## 2018-04-17 ENCOUNTER — Ambulatory Visit (INDEPENDENT_AMBULATORY_CARE_PROVIDER_SITE_OTHER): Payer: 59 | Admitting: Psychology

## 2018-04-17 DIAGNOSIS — F4310 Post-traumatic stress disorder, unspecified: Secondary | ICD-10-CM

## 2018-04-24 ENCOUNTER — Ambulatory Visit: Payer: 59 | Admitting: Psychology

## 2018-04-24 DIAGNOSIS — F4310 Post-traumatic stress disorder, unspecified: Secondary | ICD-10-CM | POA: Diagnosis not present

## 2018-05-01 ENCOUNTER — Ambulatory Visit: Payer: 59 | Admitting: Psychology

## 2018-05-08 ENCOUNTER — Ambulatory Visit: Payer: 59 | Admitting: Psychology

## 2018-05-15 ENCOUNTER — Ambulatory Visit: Payer: 59 | Admitting: Psychology

## 2018-05-15 DIAGNOSIS — F4310 Post-traumatic stress disorder, unspecified: Secondary | ICD-10-CM | POA: Diagnosis not present

## 2018-05-22 ENCOUNTER — Ambulatory Visit: Payer: 59 | Admitting: Psychology

## 2018-05-29 ENCOUNTER — Ambulatory Visit: Payer: 59 | Admitting: Psychology

## 2018-05-29 DIAGNOSIS — F4310 Post-traumatic stress disorder, unspecified: Secondary | ICD-10-CM

## 2018-06-05 ENCOUNTER — Ambulatory Visit: Payer: 59 | Admitting: Psychology

## 2018-06-12 ENCOUNTER — Ambulatory Visit: Payer: 59 | Admitting: Psychology

## 2018-06-12 DIAGNOSIS — F4310 Post-traumatic stress disorder, unspecified: Secondary | ICD-10-CM

## 2018-06-19 ENCOUNTER — Ambulatory Visit: Payer: 59 | Admitting: Psychology

## 2018-06-19 ENCOUNTER — Other Ambulatory Visit: Payer: Self-pay | Admitting: Obstetrics & Gynecology

## 2018-06-19 DIAGNOSIS — Z1231 Encounter for screening mammogram for malignant neoplasm of breast: Secondary | ICD-10-CM

## 2018-06-26 ENCOUNTER — Ambulatory Visit: Payer: 59 | Admitting: Psychology

## 2018-06-26 DIAGNOSIS — F4310 Post-traumatic stress disorder, unspecified: Secondary | ICD-10-CM | POA: Diagnosis not present

## 2018-07-03 ENCOUNTER — Ambulatory Visit (INDEPENDENT_AMBULATORY_CARE_PROVIDER_SITE_OTHER): Payer: 59 | Admitting: Psychology

## 2018-07-03 DIAGNOSIS — F4310 Post-traumatic stress disorder, unspecified: Secondary | ICD-10-CM

## 2018-07-10 ENCOUNTER — Ambulatory Visit: Payer: 59 | Admitting: Psychology

## 2018-07-11 ENCOUNTER — Ambulatory Visit
Admission: RE | Admit: 2018-07-11 | Discharge: 2018-07-11 | Disposition: A | Discharge status: Home / Self Care | Payer: Managed Care, Other (non HMO) | Source: Ambulatory Visit | Attending: Obstetrics & Gynecology | Admitting: Obstetrics & Gynecology

## 2018-07-11 DIAGNOSIS — Z1231 Encounter for screening mammogram for malignant neoplasm of breast: Secondary | ICD-10-CM | POA: Diagnosis not present

## 2018-07-17 ENCOUNTER — Ambulatory Visit: Payer: 59 | Admitting: Psychology

## 2018-07-24 ENCOUNTER — Ambulatory Visit: Payer: 59 | Admitting: Psychology

## 2018-07-24 DIAGNOSIS — F4310 Post-traumatic stress disorder, unspecified: Secondary | ICD-10-CM

## 2018-07-31 ENCOUNTER — Ambulatory Visit: Payer: 59 | Admitting: Psychology

## 2018-08-07 ENCOUNTER — Ambulatory Visit: Payer: 59 | Admitting: Psychology

## 2018-08-14 ENCOUNTER — Ambulatory Visit: Payer: 59 | Admitting: Psychology

## 2018-08-14 DIAGNOSIS — F4310 Post-traumatic stress disorder, unspecified: Secondary | ICD-10-CM | POA: Diagnosis not present

## 2018-08-21 ENCOUNTER — Ambulatory Visit: Payer: 59 | Admitting: Psychology

## 2018-08-29 ENCOUNTER — Ambulatory Visit: Payer: 59 | Admitting: Psychology

## 2018-09-04 ENCOUNTER — Ambulatory Visit: Payer: 59 | Admitting: Psychology

## 2018-09-04 DIAGNOSIS — F4310 Post-traumatic stress disorder, unspecified: Secondary | ICD-10-CM

## 2018-09-11 ENCOUNTER — Ambulatory Visit: Payer: 59 | Admitting: Psychology

## 2018-09-18 ENCOUNTER — Ambulatory Visit: Payer: 59 | Admitting: Psychology

## 2018-09-25 ENCOUNTER — Ambulatory Visit: Payer: 59 | Admitting: Psychology

## 2018-10-02 ENCOUNTER — Ambulatory Visit: Payer: 59 | Admitting: Psychology

## 2018-10-09 ENCOUNTER — Ambulatory Visit: Payer: 59 | Admitting: Psychology

## 2018-10-09 DIAGNOSIS — F4310 Post-traumatic stress disorder, unspecified: Secondary | ICD-10-CM | POA: Diagnosis not present

## 2018-10-16 ENCOUNTER — Ambulatory Visit: Payer: 59 | Admitting: Psychology

## 2018-10-23 ENCOUNTER — Ambulatory Visit: Payer: 59 | Admitting: Psychology

## 2018-10-30 ENCOUNTER — Ambulatory Visit: Payer: 59 | Admitting: Psychology

## 2018-10-30 DIAGNOSIS — F4310 Post-traumatic stress disorder, unspecified: Secondary | ICD-10-CM

## 2018-11-06 ENCOUNTER — Ambulatory Visit: Payer: 59 | Admitting: Psychology

## 2018-11-13 ENCOUNTER — Ambulatory Visit: Payer: 59 | Admitting: Psychology

## 2018-11-20 ENCOUNTER — Ambulatory Visit: Payer: 59 | Admitting: Psychology

## 2018-11-27 ENCOUNTER — Ambulatory Visit: Payer: 59 | Admitting: Psychology

## 2018-12-04 ENCOUNTER — Ambulatory Visit: Payer: 59 | Admitting: Psychology

## 2018-12-04 DIAGNOSIS — F4310 Post-traumatic stress disorder, unspecified: Secondary | ICD-10-CM | POA: Diagnosis not present

## 2018-12-11 ENCOUNTER — Ambulatory Visit: Payer: 59 | Admitting: Psychology

## 2018-12-18 ENCOUNTER — Ambulatory Visit: Payer: 59 | Admitting: Psychology

## 2018-12-25 ENCOUNTER — Ambulatory Visit: Payer: 59 | Admitting: Psychology

## 2019-01-01 ENCOUNTER — Ambulatory Visit: Payer: 59 | Admitting: Psychology

## 2019-01-08 ENCOUNTER — Ambulatory Visit: Payer: 59 | Admitting: Psychology

## 2019-01-15 ENCOUNTER — Ambulatory Visit: Payer: 59 | Admitting: Psychology

## 2019-01-22 ENCOUNTER — Ambulatory Visit: Payer: 59 | Admitting: Psychology

## 2019-01-29 ENCOUNTER — Ambulatory Visit: Payer: 59 | Admitting: Psychology

## 2019-02-05 ENCOUNTER — Ambulatory Visit: Payer: 59 | Admitting: Psychology

## 2019-02-12 ENCOUNTER — Ambulatory Visit: Payer: 59 | Admitting: Psychology

## 2019-02-19 ENCOUNTER — Ambulatory Visit: Payer: 59 | Admitting: Psychology

## 2019-02-26 ENCOUNTER — Ambulatory Visit: Payer: 59 | Admitting: Psychology

## 2019-03-05 ENCOUNTER — Ambulatory Visit: Payer: 59 | Admitting: Psychology

## 2019-03-12 ENCOUNTER — Ambulatory Visit: Payer: 59 | Admitting: Psychology

## 2019-03-19 ENCOUNTER — Ambulatory Visit: Payer: 59 | Admitting: Psychology

## 2019-03-26 ENCOUNTER — Ambulatory Visit: Payer: 59 | Admitting: Psychology

## 2019-04-02 ENCOUNTER — Ambulatory Visit: Payer: 59 | Admitting: Psychology

## 2019-04-09 ENCOUNTER — Ambulatory Visit: Payer: 59 | Admitting: Psychology

## 2019-04-16 ENCOUNTER — Ambulatory Visit: Payer: 59 | Admitting: Psychology

## 2019-04-23 ENCOUNTER — Ambulatory Visit: Payer: 59 | Admitting: Psychology

## 2019-04-30 ENCOUNTER — Ambulatory Visit: Payer: 59 | Admitting: Psychology

## 2019-05-14 ENCOUNTER — Ambulatory Visit: Payer: 59 | Admitting: Psychology

## 2019-05-28 ENCOUNTER — Ambulatory Visit: Payer: 59 | Admitting: Psychology

## 2019-08-25 ENCOUNTER — Other Ambulatory Visit: Payer: Self-pay

## 2019-08-25 DIAGNOSIS — Z20822 Contact with and (suspected) exposure to covid-19: Secondary | ICD-10-CM

## 2019-08-27 LAB — NOVEL CORONAVIRUS, NAA: SARS-CoV-2, NAA: NOT DETECTED

## 2019-09-18 ENCOUNTER — Emergency Department: Payer: Managed Care, Other (non HMO)

## 2019-09-18 ENCOUNTER — Other Ambulatory Visit: Payer: Self-pay

## 2019-09-18 DIAGNOSIS — Z87891 Personal history of nicotine dependence: Secondary | ICD-10-CM | POA: Insufficient documentation

## 2019-09-18 DIAGNOSIS — M25552 Pain in left hip: Secondary | ICD-10-CM | POA: Insufficient documentation

## 2019-09-18 DIAGNOSIS — W2210XA Striking against or struck by unspecified automobile airbag, initial encounter: Secondary | ICD-10-CM | POA: Diagnosis not present

## 2019-09-18 DIAGNOSIS — Y998 Other external cause status: Secondary | ICD-10-CM | POA: Diagnosis not present

## 2019-09-18 DIAGNOSIS — S20212A Contusion of left front wall of thorax, initial encounter: Secondary | ICD-10-CM | POA: Insufficient documentation

## 2019-09-18 DIAGNOSIS — S199XXA Unspecified injury of neck, initial encounter: Secondary | ICD-10-CM | POA: Diagnosis present

## 2019-09-18 DIAGNOSIS — Y9389 Activity, other specified: Secondary | ICD-10-CM | POA: Diagnosis not present

## 2019-09-18 DIAGNOSIS — J45909 Unspecified asthma, uncomplicated: Secondary | ICD-10-CM | POA: Insufficient documentation

## 2019-09-18 DIAGNOSIS — Z79899 Other long term (current) drug therapy: Secondary | ICD-10-CM | POA: Insufficient documentation

## 2019-09-18 DIAGNOSIS — S161XXA Strain of muscle, fascia and tendon at neck level, initial encounter: Secondary | ICD-10-CM | POA: Diagnosis not present

## 2019-09-18 DIAGNOSIS — Y92410 Unspecified street and highway as the place of occurrence of the external cause: Secondary | ICD-10-CM | POA: Insufficient documentation

## 2019-09-18 DIAGNOSIS — S40012A Contusion of left shoulder, initial encounter: Secondary | ICD-10-CM | POA: Diagnosis not present
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
## 2019-09-18 NOTE — ED Notes (Signed)
Nicki Guadalajara called to check on the patient. I advised her I would let the patient know that she called. Advised the patient of same.
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
## 2019-09-19 ENCOUNTER — Emergency Department
Admission: EM | Admit: 2019-09-19 | Discharge: 2019-09-19 | Disposition: A | Discharge status: Home / Self Care | Payer: Managed Care, Other (non HMO) | Attending: Emergency Medicine | Admitting: Emergency Medicine

## 2019-09-19 DIAGNOSIS — M25559 Pain in unspecified hip: Secondary | ICD-10-CM

## 2019-09-19 DIAGNOSIS — S20212A Contusion of left front wall of thorax, initial encounter: Secondary | ICD-10-CM

## 2019-09-19 DIAGNOSIS — S40012A Contusion of left shoulder, initial encounter: Secondary | ICD-10-CM

## 2019-09-19 DIAGNOSIS — S161XXA Strain of muscle, fascia and tendon at neck level, initial encounter: Secondary | ICD-10-CM

## 2019-09-19 MED ORDER — CYCLOBENZAPRINE HCL 10 MG PO TABS
5.0000 mg | ORAL_TABLET | Freq: Once | ORAL | Status: AC
Start: 2019-09-19 — End: 2019-09-19
  Administered 2019-09-19: 5 mg via ORAL
  Filled 2019-09-19: qty 1

## 2019-09-19 MED ORDER — HYDROCODONE-ACETAMINOPHEN 5-325 MG PO TABS: 1.0000 | ORAL_TABLET | Freq: Four times a day (QID) | ORAL | 0 refills | Status: AC | PRN

## 2019-09-19 MED ORDER — CYCLOBENZAPRINE HCL 5 MG PO TABS: ORAL_TABLET | ORAL | 0 refills | Status: AC

## 2019-09-19 MED ORDER — IBUPROFEN 600 MG PO TABS: 600.0000 mg | ORAL_TABLET | Freq: Three times a day (TID) | ORAL | 0 refills | Status: AC | PRN

## 2019-09-19 MED ORDER — HYDROCODONE-ACETAMINOPHEN 5-325 MG PO TABS
1.0000 | ORAL_TABLET | Freq: Once | ORAL | Status: AC
  Administered 2019-09-19: 1 via ORAL
  Filled 2019-09-19: qty 1

## 2019-09-19 MED ORDER — IBUPROFEN 600 MG PO TABS
600.0000 mg | ORAL_TABLET | Freq: Once | ORAL | Status: AC
  Administered 2019-09-19: 600 mg via ORAL
  Filled 2019-09-19: qty 1

## 2019-09-19 NOTE — Discharge Instructions (Signed)
1.  Take medicines as needed for pain and muscle spasms (Motrin/Norco/Flexeril #15). 2.  Apply ice to affected area several times daily. 3.  Return to the ER for worsening symptoms, persistent vomiting, difficulty breathing or other concerns.
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
## 2019-10-20 ENCOUNTER — Other Ambulatory Visit: Payer: Self-pay | Admitting: Obstetrics & Gynecology

## 2019-10-20 DIAGNOSIS — Z1231 Encounter for screening mammogram for malignant neoplasm of breast: Secondary | ICD-10-CM

## 2019-10-22 ENCOUNTER — Ambulatory Visit
Admission: RE | Admit: 2019-10-22 | Discharge: 2019-10-22 | Disposition: A | Discharge status: Home / Self Care | Payer: Managed Care, Other (non HMO) | Source: Ambulatory Visit | Attending: Obstetrics & Gynecology | Admitting: Obstetrics & Gynecology

## 2019-10-22 DIAGNOSIS — Z1231 Encounter for screening mammogram for malignant neoplasm of breast: Secondary | ICD-10-CM | POA: Insufficient documentation

## 2019-10-27 ENCOUNTER — Other Ambulatory Visit: Payer: Self-pay | Admitting: Obstetrics & Gynecology

## 2019-10-27 DIAGNOSIS — N6310 Unspecified lump in the right breast, unspecified quadrant: Secondary | ICD-10-CM

## 2019-10-27 DIAGNOSIS — R928 Other abnormal and inconclusive findings on diagnostic imaging of breast: Secondary | ICD-10-CM

## 2019-10-29 ENCOUNTER — Ambulatory Visit
Admission: RE | Admit: 2019-10-29 | Discharge: 2019-10-29 | Disposition: A | Discharge status: Home / Self Care | Payer: Managed Care, Other (non HMO) | Source: Ambulatory Visit | Attending: Obstetrics & Gynecology | Admitting: Obstetrics & Gynecology

## 2019-10-29 DIAGNOSIS — R928 Other abnormal and inconclusive findings on diagnostic imaging of breast: Secondary | ICD-10-CM | POA: Diagnosis not present

## 2019-10-29 DIAGNOSIS — N6310 Unspecified lump in the right breast, unspecified quadrant: Secondary | ICD-10-CM

## 2019-11-05 ENCOUNTER — Other Ambulatory Visit: Payer: Self-pay | Admitting: Obstetrics & Gynecology

## 2019-11-05 DIAGNOSIS — R928 Other abnormal and inconclusive findings on diagnostic imaging of breast: Secondary | ICD-10-CM

## 2019-11-06 ENCOUNTER — Ambulatory Visit
Admission: RE | Admit: 2019-11-06 | Discharge: 2019-11-06 | Disposition: A | Discharge status: Home / Self Care | Payer: Managed Care, Other (non HMO) | Source: Ambulatory Visit | Attending: Obstetrics & Gynecology | Admitting: Obstetrics & Gynecology

## 2019-11-06 DIAGNOSIS — R928 Other abnormal and inconclusive findings on diagnostic imaging of breast: Secondary | ICD-10-CM

## 2019-11-06 HISTORY — PX: BREAST BIOPSY: SHX20

## 2019-11-10 LAB — SURGICAL PATHOLOGY

## 2020-09-28 IMAGING — MG MM BREAST BX W/ LOC DEV 1ST LESION IMAGE BX SPEC STEREO GUIDE*R*
8 of 11 series · 8 of 19 positions shown · non-contrast
Comparison: Previous exams.
COMPARISON: Previous exams.

Addendum:
CLINICAL DATA: Patient presents for stereotactic guided core biopsy
of RIGHT breast distortion.

EXAM:
RIGHT BREAST STEREOTACTIC CORE NEEDLE BIOPSY

[R (1 of 7)]
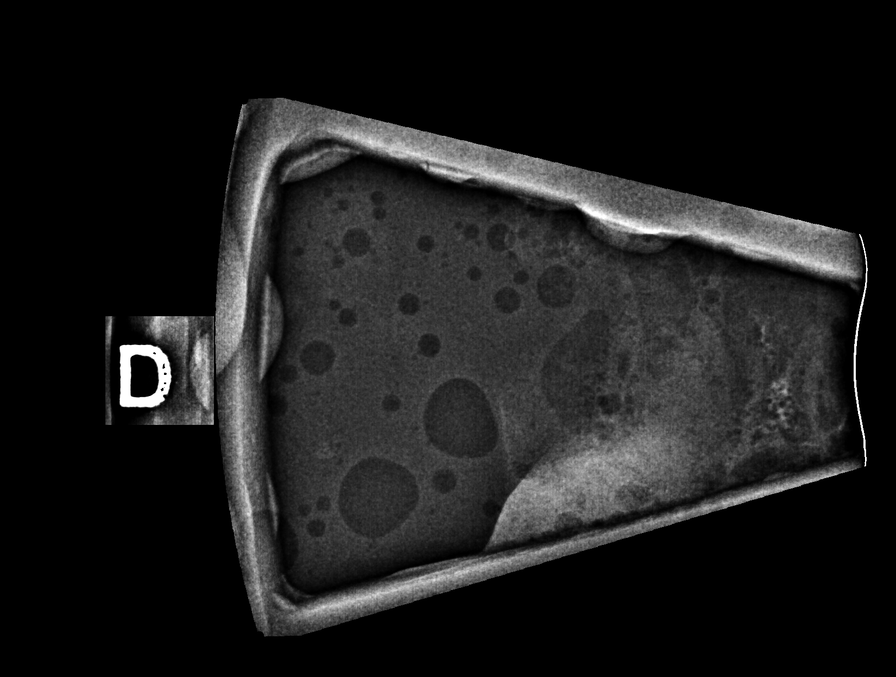

[R (2 of 7)]
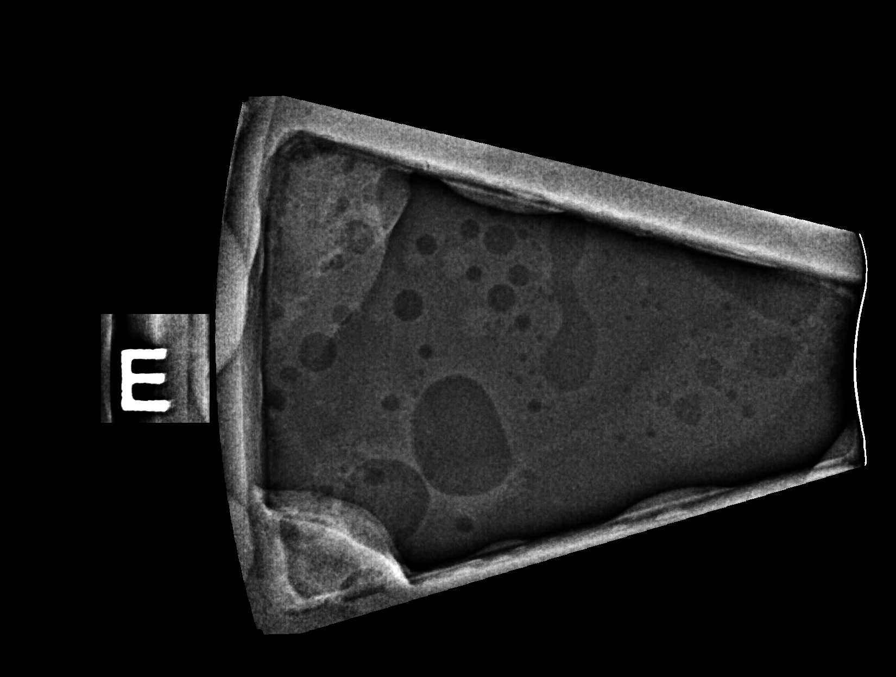

[R (3 of 7)]
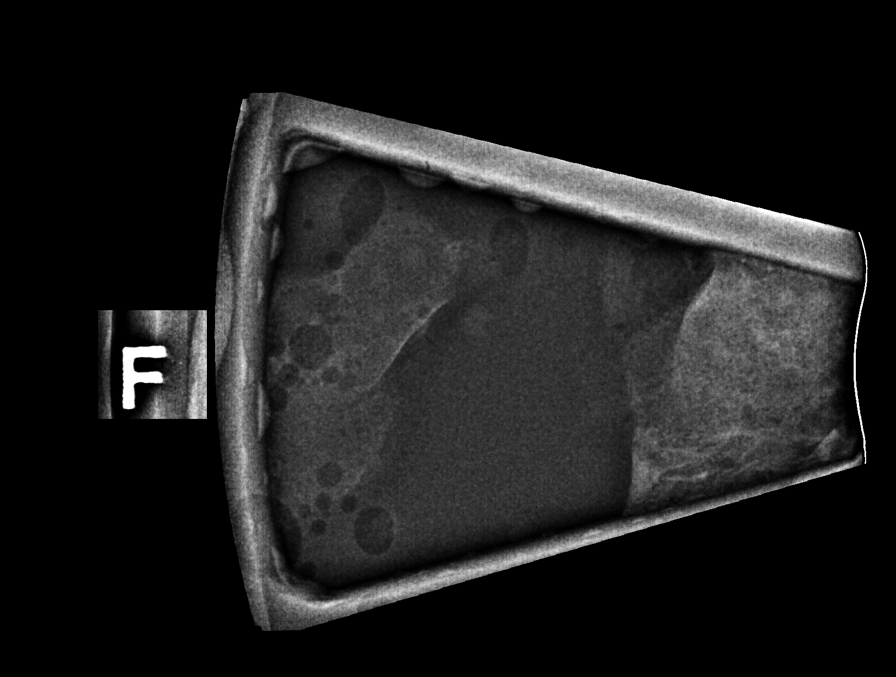

[R (4 of 7)]
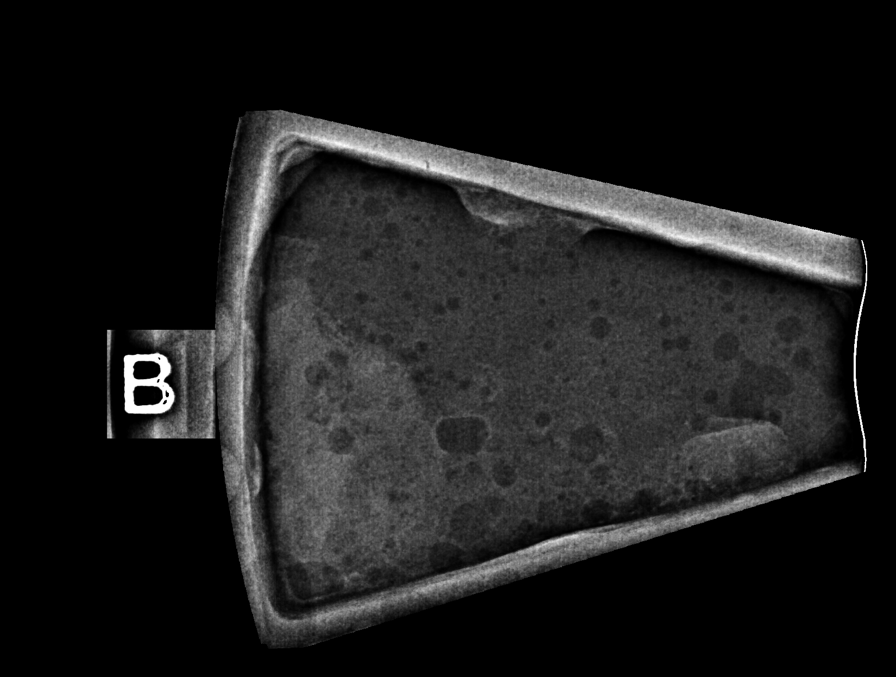

[R (5 of 7)]
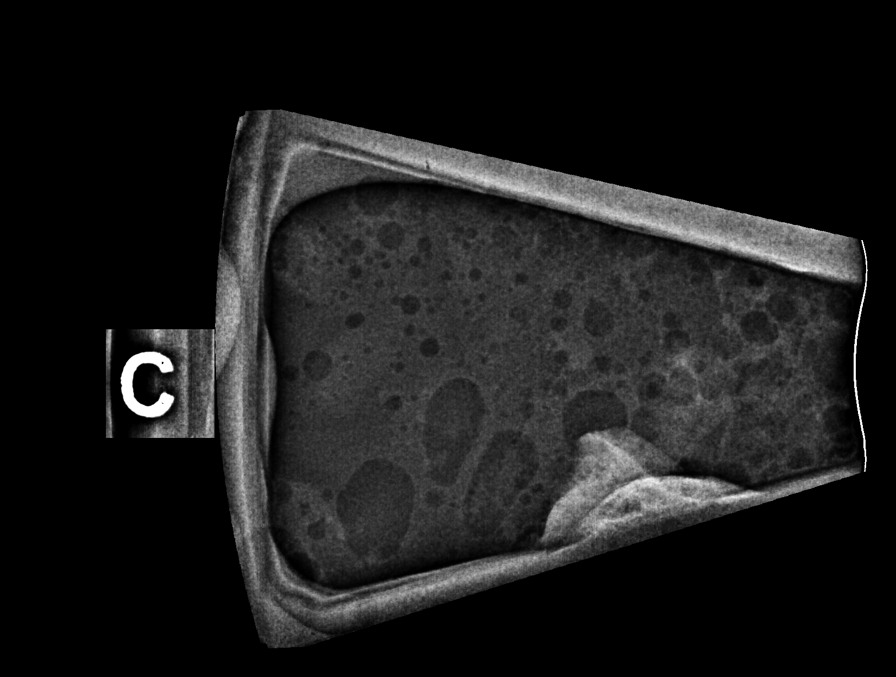

[R (6 of 7)]
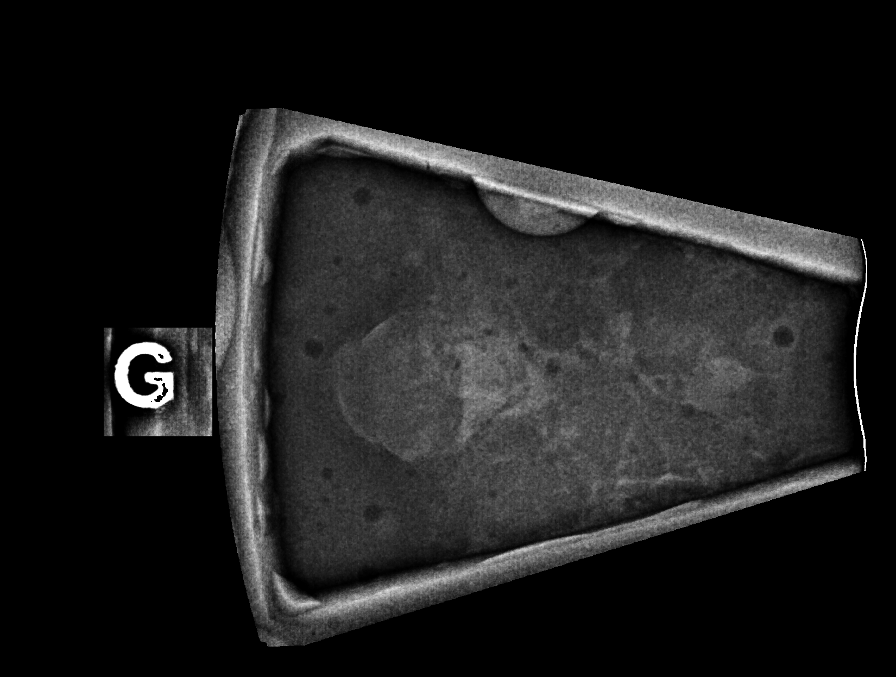

[R (7 of 7)]
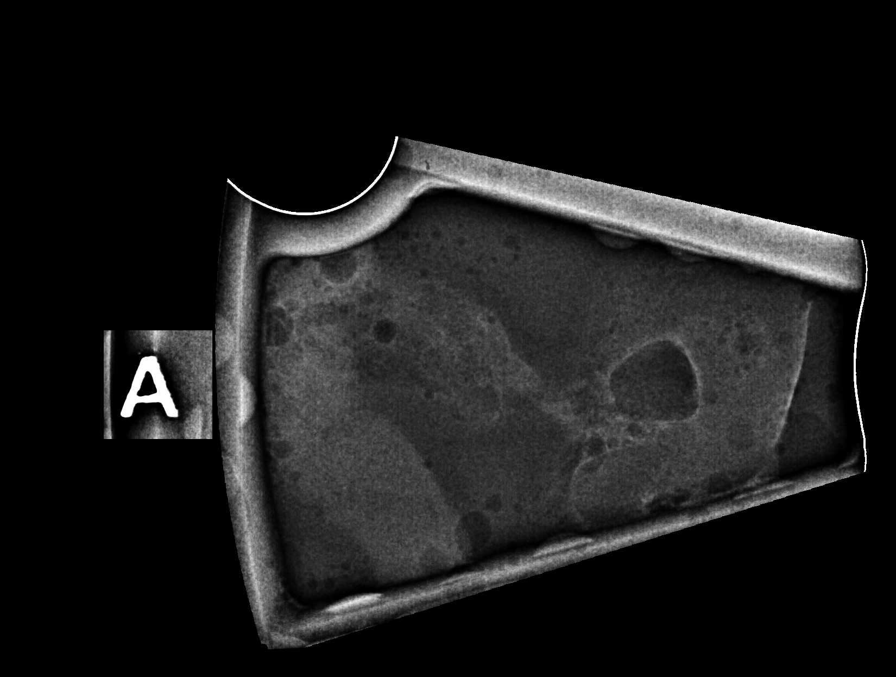

[R XCCM synth-2D]
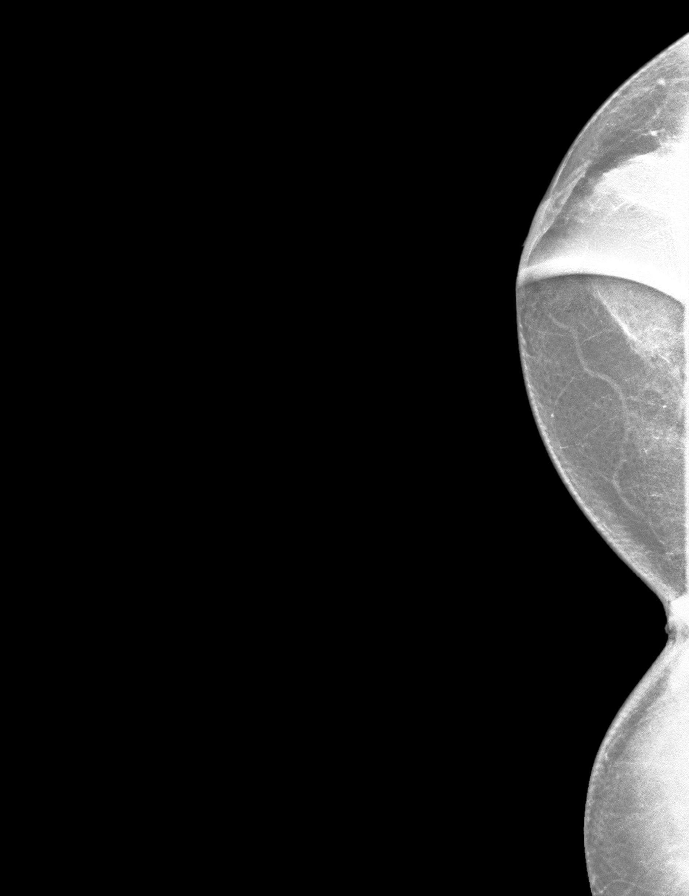

[8 of 19 positions shown; findings below may reference images not displayed]



Using sterile technique and 1% lidocaine and 1% lidocaine with
epinephrine as local anesthetic, under stereotactic guidance, a 9
gauge vacuum assisted device was used to perform core needle biopsy
of distortion in the UPPER INNER QUADRANT of the RIGHT breast using
a craniocaudal approach.

Lesion quadrant: UPPER-OUTER QUADRANT RIGHT breast

At the conclusion of the procedure, coil shaped tissue marker clip
was deployed into the biopsy cavity. Follow-up 2-view mammogram was
performed and dictated separately.
IMPRESSION: Stereotactic-guided biopsy of RIGHT breast distortion. No apparent
complications.

ADDENDUM:
Pathology revealed BENIGN MAMMARY PARENCHYMA WITH SCLEROSING
ADENOSIS, FIBROCYSTIC CHANGES, AND APOCRINE CHANGE of the RIGHT
breast, upper inner quadrant, (coil clip). This was found to be
discordant by Dr. Arissa Billiot, with excision recommended.

Pathology results were discussed with the patient by telephone. The
patient reported doing well after the biopsy with tenderness at the
site. Post biopsy instructions and care were reviewed and questions
were answered. The patient was encouraged to call The [HOSPITAL]
[facility] for any
additional concerns.

Surgical Referral will be arranged by [facility] of
[HOSPITAL].

Pathology results reported by Labelle Salha Brack Tiger RN on 11/10/2019.



Using sterile technique and 1% lidocaine and 1% lidocaine with
epinephrine as local anesthetic, under stereotactic guidance, a 9
gauge vacuum assisted device was used to perform core needle biopsy
of distortion in the UPPER INNER QUADRANT of the RIGHT breast using
a craniocaudal approach.

Lesion quadrant: UPPER-OUTER QUADRANT RIGHT breast

At the conclusion of the procedure, coil shaped tissue marker clip
was deployed into the biopsy cavity. Follow-up 2-view mammogram was
performed and dictated separately.
IMPRESSION: Stereotactic-guided biopsy of RIGHT breast distortion. No apparent
complications.
